# Patient Record
Sex: Male | Born: 1980 | Race: Black or African American | Hispanic: No | Marital: Single | Smoking: Never smoker
Health system: Southern US, Community
[De-identification: ages and names within clinical notes are randomized; demographics above are authoritative.]

## PROBLEM LIST (undated history)

## (undated) DIAGNOSIS — G4730 Sleep apnea, unspecified: Secondary | ICD-10-CM

## (undated) HISTORY — DX: Sleep apnea, unspecified: G47.30

---
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
## 2006-12-06 ENCOUNTER — Emergency Department (HOSPITAL_COMMUNITY): Admission: EM | Admit: 2006-12-06 | Discharge: 2006-12-06 | Payer: Self-pay | Admitting: Emergency Medicine

## 2008-05-19 ENCOUNTER — Emergency Department (HOSPITAL_COMMUNITY): Admission: EM | Admit: 2008-05-19 | Discharge: 2008-05-19 | Payer: Self-pay | Admitting: Emergency Medicine

## 2009-07-05 ENCOUNTER — Emergency Department (HOSPITAL_COMMUNITY): Admission: EM | Admit: 2009-07-05 | Discharge: 2009-07-05 | Payer: Self-pay | Admitting: Emergency Medicine

## 2010-02-26 ENCOUNTER — Emergency Department (HOSPITAL_COMMUNITY): Admission: EM | Admit: 2010-02-26 | Discharge: 2010-02-26 | Payer: Self-pay | Admitting: Emergency Medicine

## 2010-07-26 ENCOUNTER — Emergency Department (HOSPITAL_COMMUNITY)
Admission: EM | Admit: 2010-07-26 | Discharge: 2010-07-27 | Disposition: A | Discharge status: Home / Self Care | Payer: BC Managed Care – PPO | Attending: Emergency Medicine | Admitting: Emergency Medicine

## 2010-07-26 DIAGNOSIS — R112 Nausea with vomiting, unspecified: Secondary | ICD-10-CM | POA: Insufficient documentation

## 2010-07-26 DIAGNOSIS — R197 Diarrhea, unspecified: Secondary | ICD-10-CM | POA: Insufficient documentation

## 2010-07-26 LAB — URINALYSIS, ROUTINE W REFLEX MICROSCOPIC
Bilirubin Urine: NEGATIVE
Hgb urine dipstick: NEGATIVE
Ketones, ur: 15 mg/dL — AB
Leukocytes, UA: NEGATIVE
Nitrite: NEGATIVE
Protein, ur: 30 mg/dL — AB
Specific Gravity, Urine: 1.03 (ref 1.005–1.030)
Urine Glucose, Fasting: NEGATIVE mg/dL
Urobilinogen, UA: 0.2 mg/dL (ref 0.0–1.0)
pH: 5.5 (ref 5.0–8.0)

## 2010-07-26 LAB — DIFFERENTIAL
Basophils Absolute: 0 10*3/uL (ref 0.0–0.1)
Basophils Relative: 0 % (ref 0–1)
Eosinophils Absolute: 0 10*3/uL (ref 0.0–0.7)
Eosinophils Relative: 0 % (ref 0–5)
Lymphocytes Relative: 5 % — ABNORMAL LOW (ref 12–46)
Lymphs Abs: 0.6 10*3/uL — ABNORMAL LOW (ref 0.7–4.0)
Monocytes Absolute: 0.8 10*3/uL (ref 0.1–1.0)
Monocytes Relative: 6 % (ref 3–12)
Neutro Abs: 11.3 10*3/uL — ABNORMAL HIGH (ref 1.7–7.7)
Neutrophils Relative %: 89 % — ABNORMAL HIGH (ref 43–77)

## 2010-07-26 LAB — CBC
MCH: 29.6 pg (ref 26.0–34.0)
MCV: 86.7 fL (ref 78.0–100.0)
Platelets: 307 10*3/uL (ref 150–400)
RDW: 12.9 % (ref 11.5–15.5)

## 2010-07-26 LAB — BASIC METABOLIC PANEL
BUN: 15 mg/dL (ref 6–23)
Creatinine, Ser: 1.39 mg/dL (ref 0.4–1.5)
GFR calc non Af Amer: >60 mL/min (ref >60)

## 2010-07-26 LAB — URINE MICROSCOPIC-ADD ON

## 2010-07-27 LAB — HEPATIC FUNCTION PANEL
Alkaline Phosphatase: 128 U/L — ABNORMAL HIGH (ref 39–117)
Bilirubin, Direct: 0.1 mg/dL (ref 0.0–0.3)
Indirect Bilirubin: 0.7 mg/dL (ref 0.3–0.9)
Total Protein: 9.2 g/dL — ABNORMAL HIGH (ref 6.0–8.3)

## 2010-07-27 LAB — LIPASE, BLOOD: Lipase: 19 U/L (ref 11–59)

## 2011-07-20 ENCOUNTER — Ambulatory Visit (HOSPITAL_BASED_OUTPATIENT_CLINIC_OR_DEPARTMENT_OTHER): Payer: BC Managed Care – PPO | Attending: Otolaryngology | Admitting: Radiology

## 2011-07-20 VITALS — Ht 71.0 in | Wt 238.0 lb

## 2011-07-20 DIAGNOSIS — G4733 Obstructive sleep apnea (adult) (pediatric): Secondary | ICD-10-CM

## 2011-07-22 DIAGNOSIS — G4733 Obstructive sleep apnea (adult) (pediatric): Secondary | ICD-10-CM

## 2011-07-25 NOTE — Procedures (Signed)
NAMEPENG, THORSTENSON NO.:  [id number]  MEDICAL RECORD NO.:  [id number]          PATIENT TYPE:  OUT  LOCATION:  SLEEP CENTER                 FACILITY:  [facility]  PHYSICIAN:  Carlus Stay D. Lachae Hohler, MD, FCCP, FACPDATE OF BIRTH:  DATE OF STUDY:                           NOCTURNAL POLYSOMNOGRAM  REFERRING PHYSICIAN:  INDICATION FOR STUDY:  Hypersomnia with sleep apnea.  EPWORTH SLEEPINESS SCORE:  4/24.  BMI 33.2, weight 238 pounds, height 71 inches, neck 19 inches.  HOME MEDICATIONS:  Charted as "no medications.Marland Kitchen  SLEEP ARCHITECTURE:  Split study protocol.  During the diagnostic phase, total sleep time 126.5 minutes with sleep efficiency 70.3%.  Stage I was 11.5%.  Stage II 88.5%, stages III and REM were absent.  Sleep latency 42.5 minutes, awake after sleep onset 8.5 minutes.  Arousal index 37.9. Bedtime medication:  None.  RESPIRATORY DATA:  Split study protocol.  Apnea/hypopnea index (AHI) 47.0 per hour.  A total of 99 events was scored including 32 obstructive apneas, 67 hypopneas.  Events were not positional.  CPAP was then titrated to 15 CWP, AHI 5.1 per hour.  He wore a medium ResMed Mirage Quattro fullface mask with heated humidifier and a C-Flex setting of 3.  OXYGEN DATA:  Before CPAP, snoring was loud with oxygen desaturation to a nadir of 83% on room air.  After CPAP titration, snoring was prevented and mean oxygen saturation held 96.1% on room air.  CARDIAC DATA:  Normal sinus rhythm.  MOVEMENT/PARASOMNIA:  No significant movement disturbance.  Bathroom x1.  IMPRESSION/RECOMMENDATION: 1. Severe obstructive sleep apnea/hypopnea syndrome, AHI 47 per hour     with nonpositional events.  Loud snoring and oxygen desaturation to     a nadir of 83% on room air. 2. Successful CPAP titration to 15 CWP, AHI 5.1 per hour.  He wore a     medium ResMed Mirage Quattro full-face mask with heated humidifier and a        C-Flex setting of 3.  Snoring was prevented and  mean oxygen saturation     normalized.     Madelaine Whipple D. Maple Hudson, MD, [facility], FACP Diplomate, American Board of Sleep Medicine    CDY/MEDQ  D:  07/22/2011 11:38:35  T:  07/22/2011 13:12:38  Job:  [id number]

## 2011-10-12 ENCOUNTER — Emergency Department (HOSPITAL_COMMUNITY): Payer: BC Managed Care – PPO

## 2011-10-12 ENCOUNTER — Encounter (HOSPITAL_COMMUNITY): Payer: Self-pay | Admitting: Emergency Medicine

## 2011-10-12 ENCOUNTER — Emergency Department (HOSPITAL_COMMUNITY)
Admission: EM | Admit: 2011-10-12 | Discharge: 2011-10-13 | Disposition: A | Discharge status: Home / Self Care | Payer: BC Managed Care – PPO | Attending: Emergency Medicine | Admitting: Emergency Medicine

## 2011-10-12 DIAGNOSIS — R05 Cough: Secondary | ICD-10-CM | POA: Insufficient documentation

## 2011-10-12 DIAGNOSIS — J40 Bronchitis, not specified as acute or chronic: Secondary | ICD-10-CM | POA: Insufficient documentation

## 2011-10-12 DIAGNOSIS — J45909 Unspecified asthma, uncomplicated: Secondary | ICD-10-CM | POA: Insufficient documentation

## 2011-10-12 DIAGNOSIS — R059 Cough, unspecified: Secondary | ICD-10-CM | POA: Insufficient documentation

## 2011-10-12 DIAGNOSIS — R0602 Shortness of breath: Secondary | ICD-10-CM | POA: Insufficient documentation

## 2011-10-12 DIAGNOSIS — M546 Pain in thoracic spine: Secondary | ICD-10-CM | POA: Insufficient documentation

## 2011-10-12 NOTE — ED Notes (Signed)
PT. REPORTS UPPER BACK PAIN WITH SOB AND PRODUCTIVE COUGH ONSET YESTERDAY.

## 2011-10-12 NOTE — ED Notes (Signed)
Patient transported to X-ray 

## 2011-10-13 MED ORDER — AEROCHAMBER PLUS W/MASK MISC
Status: AC
  Administered 2011-10-13: 01:00:00
  Filled 2011-10-13: qty 1

## 2011-10-13 MED ORDER — IBUPROFEN 800 MG PO TABS: 800.0000 mg | ORAL_TABLET | Freq: Four times a day (QID) | ORAL | Status: AC | PRN

## 2011-10-13 MED ORDER — ALBUTEROL SULFATE HFA 108 (90 BASE) MCG/ACT IN AERS
2.0000 | INHALATION_SPRAY | Freq: Once | RESPIRATORY_TRACT | Status: AC
  Administered 2011-10-13: 2 via RESPIRATORY_TRACT
  Filled 2011-10-13: qty 6.7

## 2011-10-13 MED ORDER — IBUPROFEN 800 MG PO TABS
800.0000 mg | ORAL_TABLET | Freq: Once | ORAL | Status: AC
  Administered 2011-10-13: 800 mg via ORAL
  Filled 2011-10-13: qty 1

## 2011-10-13 MED ORDER — AZITHROMYCIN 250 MG PO TABS: 250.0000 mg | ORAL_TABLET | Freq: Every day | ORAL | Status: AC

## 2011-10-13 NOTE — ED Provider Notes (Signed)
Medical screening examination/treatment/procedure(s) were performed by non-physician practitioner and as supervising physician I was immediately available for consultation/collaboration.  Olivia Mackie, MD 10/13/11 [phone]

## 2011-10-13 NOTE — ED Provider Notes (Signed)
History     CSN: [id number]  Arrival date & time 10/12/11  2321   First MD Initiated Contact with Patient 10/12/11 2345      Chief Complaint  Patient presents with  . Back Pain    (Consider location/radiation/quality/duration/timing/severity/associated sxs/prior treatment) HPI Comments: Patient here with cough, shortness of breath and upper back pain since yesterday - he states no fever or chills, reports pain worse with lying flat - denies chest pain or wheezing - reports that one of his co-workers was ill the other day and coughed on him - no headache, sore throat, neck pain.  Patient is a 31 y.o. male presenting with back pain. The history is provided by the patient. No language interpreter was used.  Back Pain  This is a new problem. The current episode started yesterday. The problem occurs constantly. The problem has not changed since onset.The pain is associated with no known injury. The pain is present in the thoracic spine. The quality of the pain is described as shooting. The pain does not radiate. The pain is at a severity of 4/10. The pain is mild. The symptoms are aggravated by bending. The pain is the same all the time. Stiffness is present all day. Pertinent negatives include no chest pain, no fever, no numbness, no weight loss, no headaches, no abdominal pain, no abdominal swelling, no bowel incontinence, no perianal numbness, no bladder incontinence, no dysuria, no pelvic pain, no leg pain, no paresthesias, no paresis, no tingling and no weakness. The treatment provided no relief.    Past Medical History  Diagnosis Date  . Asthma     History reviewed. No pertinent past surgical history.  No family history on file.  History  Substance Use Topics  . Smoking status: Never Smoker   . Smokeless tobacco: Not on file  . Alcohol Use: Yes      Review of Systems  Constitutional: Negative for fever and weight loss.  Cardiovascular: Negative for chest pain.    Gastrointestinal: Negative for abdominal pain and bowel incontinence.  Genitourinary: Negative for bladder incontinence, dysuria and pelvic pain.  Musculoskeletal: Positive for back pain.  Neurological: Negative for tingling, weakness, numbness, headaches and paresthesias.  All other systems reviewed and are negative.    Allergies  Review of patient's allergies indicates no known allergies.  Home Medications   Current Outpatient Rx  Name Route Sig Dispense Refill  . ADULT MULTIVITAMIN W/MINERALS CH Oral Take 1 tablet by mouth daily.    . AZITHROMYCIN 250 MG PO TABS Oral Take 1 tablet (250 mg total) by mouth daily. Take first 2 tablets together, then 1 every day until finished. 6 tablet 0  . IBUPROFEN 800 MG PO TABS Oral Take 1 tablet (800 mg total) by mouth every 6 (six) hours as needed for pain. 30 tablet 0    BP 139/73  Pulse 87  Temp(Src) 99.7 F (37.6 C) (Oral)  Resp 22  SpO2 93%  Physical Exam  Nursing note and vitals reviewed. Constitutional: He is oriented to person, place, and time. He appears well-developed and well-nourished. No distress.  HENT:  Head: Normocephalic and atraumatic.  Right Ear: External ear normal.  Left Ear: External ear normal.  Nose: Nose normal.  Mouth/Throat: Oropharynx is clear and moist. No oropharyngeal exudate.  Eyes: Conjunctivae are normal. Pupils are equal, round, and reactive to light. No scleral icterus.  Neck: Normal range of motion. Neck supple.  Cardiovascular: Normal rate, regular rhythm and normal heart sounds.  Exam  reveals no gallop and no friction rub.   No murmur heard. Pulmonary/Chest: Effort normal and breath sounds normal. No respiratory distress. He has no wheezes. He has no rales. He exhibits no tenderness.  Abdominal: Soft. Bowel sounds are normal. He exhibits no distension and no mass. There is no tenderness. There is no rebound and no guarding.  Musculoskeletal: Normal range of motion. He exhibits no edema and no  tenderness.       No ttp to thoracic back  Lymphadenopathy:    He has no cervical adenopathy.  Neurological: He is alert and oriented to person, place, and time. No cranial nerve deficit.  Skin: Skin is warm and dry. No rash noted. No erythema. No pallor.  Psychiatric: He has a normal mood and affect. His behavior is normal. Judgment and thought content normal.    ED Course  Procedures (including critical care time)  Labs Reviewed - No data to display Dg Chest 2 View  10/13/2011  *RADIOLOGY REPORT*  Clinical Data: Upper back pain and shortness of breath  CHEST - 2 VIEW  Comparison: 02/14/2010  Findings: The heart size and pulmonary vascularity are normal. The lungs appear clear and expanded without focal air space disease or consolidation. No blunting of the costophrenic angles.  No pneumothorax.  No significant change since previous study.  IMPRESSION: No evidence of active pulmonary disease.  Original Report Authenticated By: Marlon Pel, M.D.     1. Bronchitis       MDM  Patient with cough with green sputum production, mid upper back pain and mild shortness of breath, xray negative for PNA but likely with bronchitis - will treat for same.        Izola Price [place], [place] 10/13/11 [phone]

## 2011-10-13 NOTE — ED Notes (Signed)
PT ambulated with a steady gait; VSS; A&Ox3; no signs of distress; respirations even and unlabored; skin warm and dry.  

## 2011-10-13 NOTE — Discharge Instructions (Signed)

## 2012-05-16 IMAGING — CR DG LUMBAR SPINE COMPLETE 4+V
5 series · 5 of 5 positions shown · non-contrast
Comparison: None

CLINICAL DATA: Back pain.  Lifting injury.

LUMBAR SPINE - COMPLETE 4+ VIEW

[t l-spine a.p.]
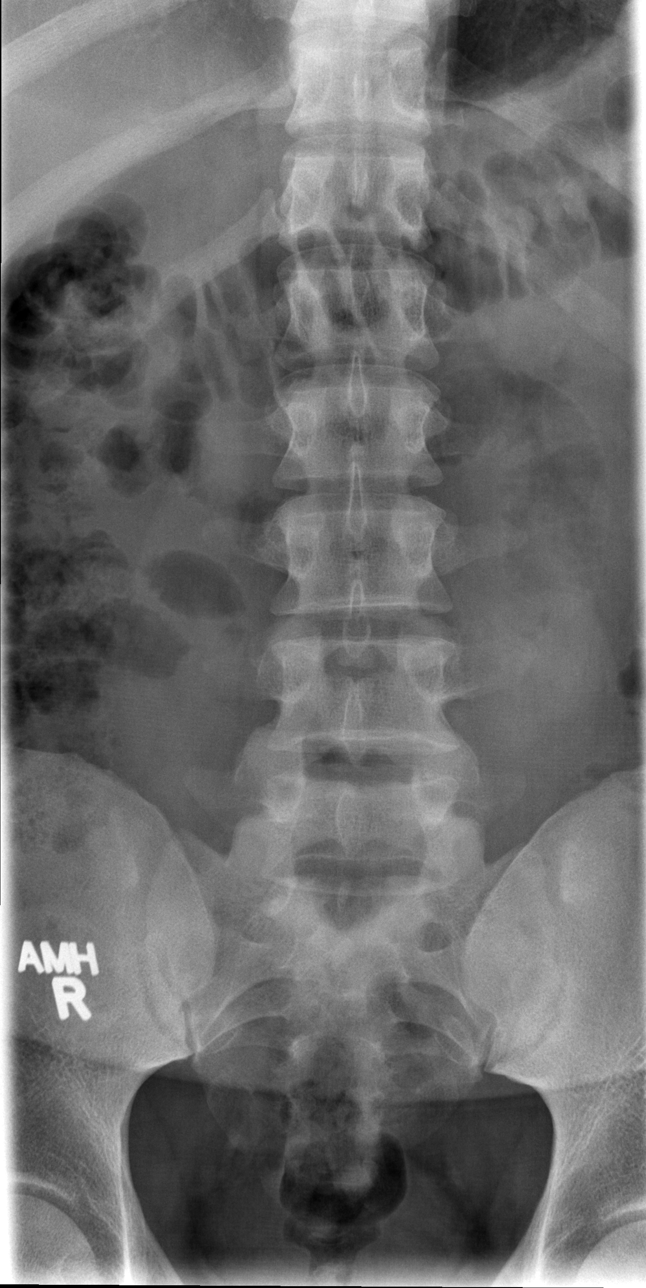

[t l-spine oblique exposure (1 of 2)]
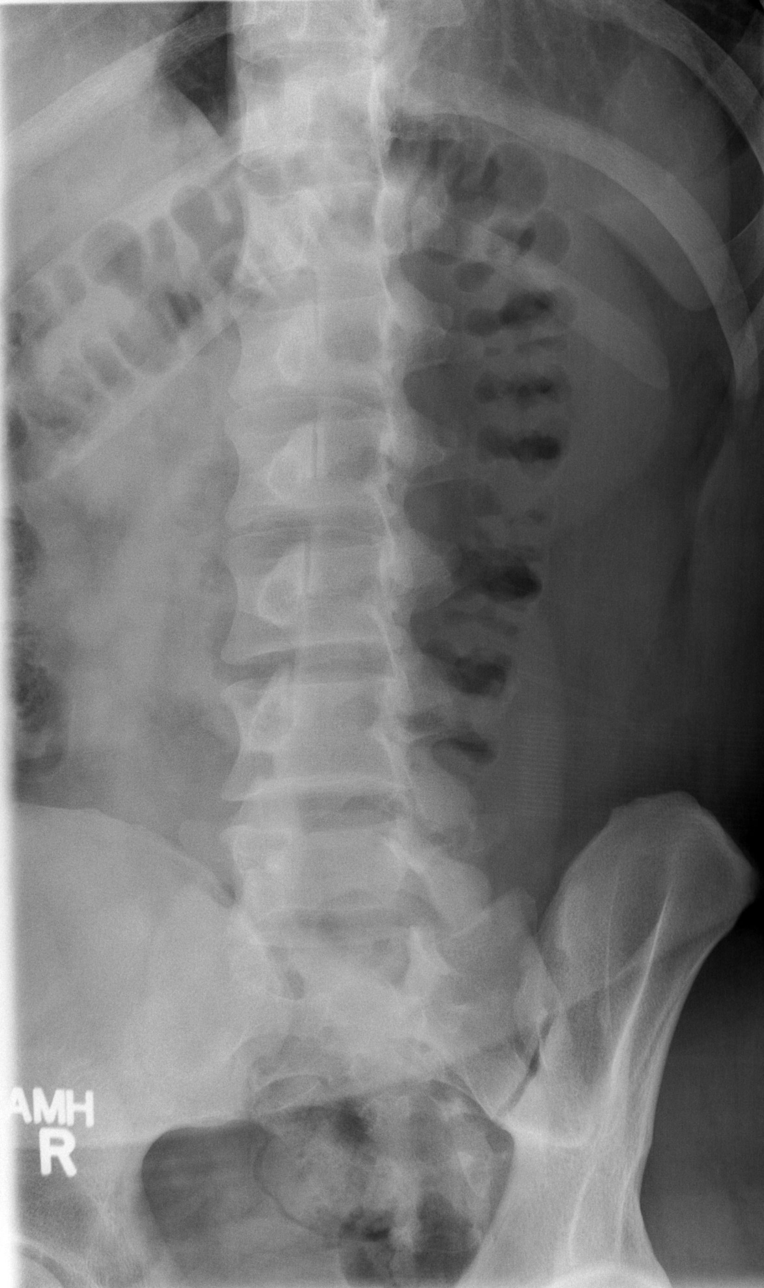

[t l-spine oblique exposure (2 of 2)]
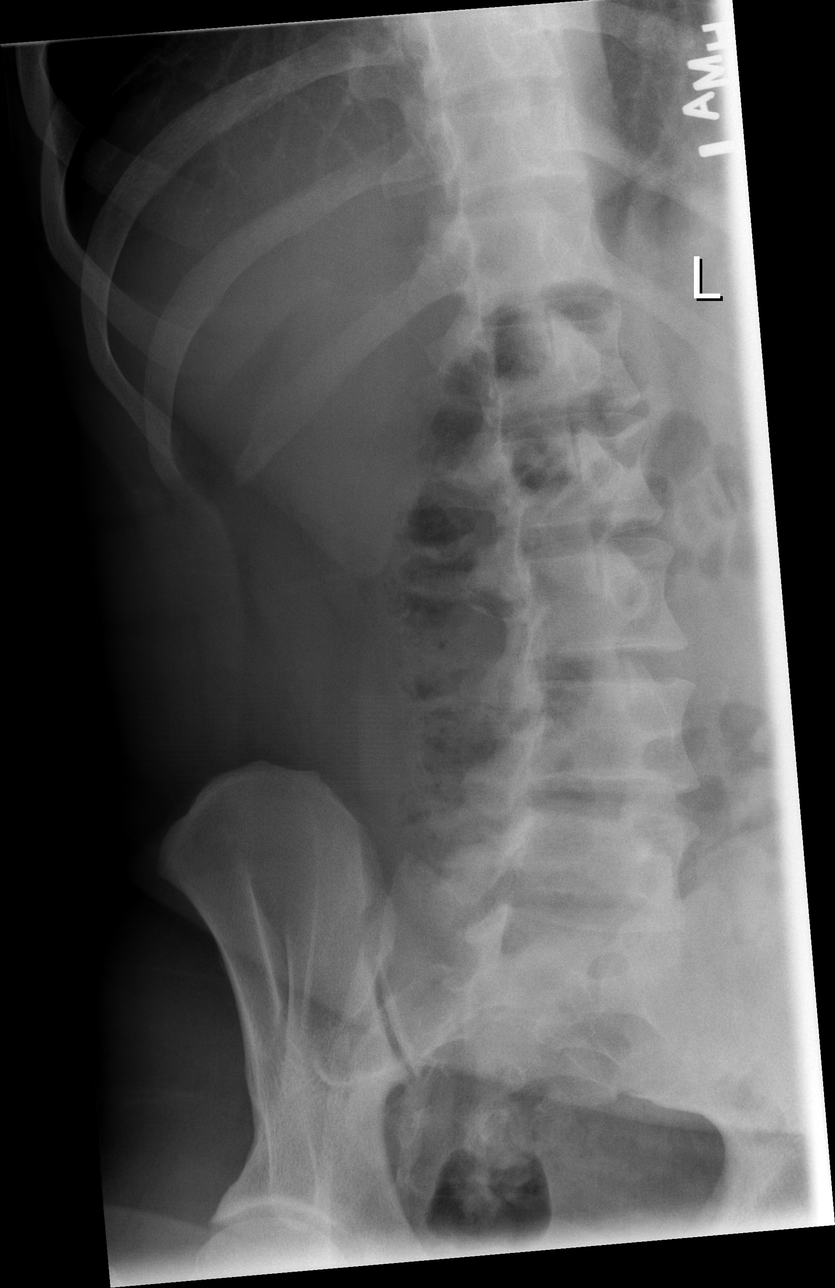

[t l-spine lat *]
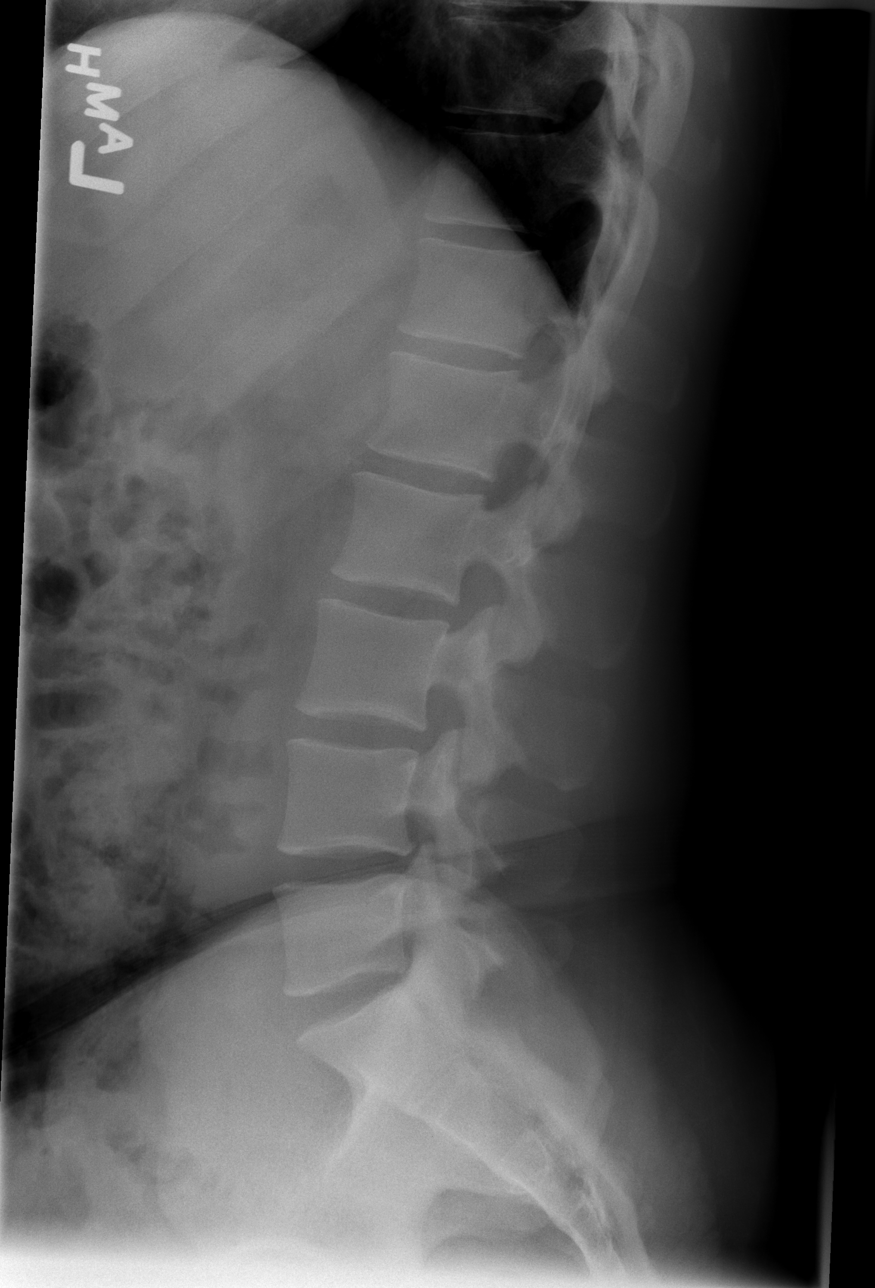

[t l-spine l5-s1 spot]
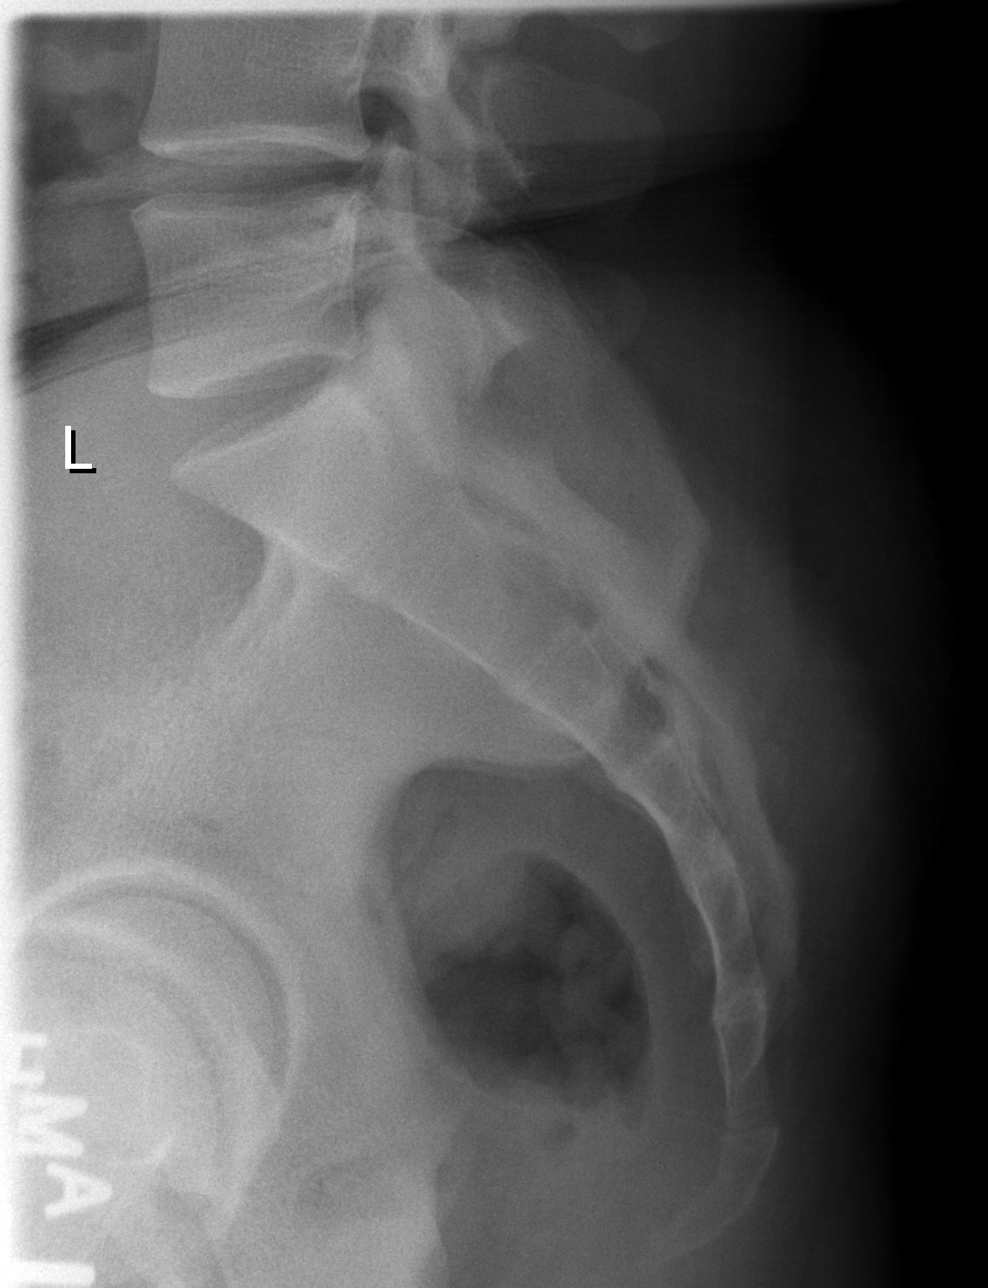

[5 of 5 positions shown; findings below may reference images not displayed]

FINDINGS: Alignment is normal.  Disc space heights are within
normal limits.  No osseous or articular pathology.
IMPRESSION: Normal radiographs

## 2013-12-31 IMAGING — CR DG CHEST 2V
2 series · 2 of 2 positions shown · non-contrast
Comparison: 02/14/2010

CLINICAL DATA: Upper back pain and shortness of breath

CHEST - 2 VIEW

[w chest pa]
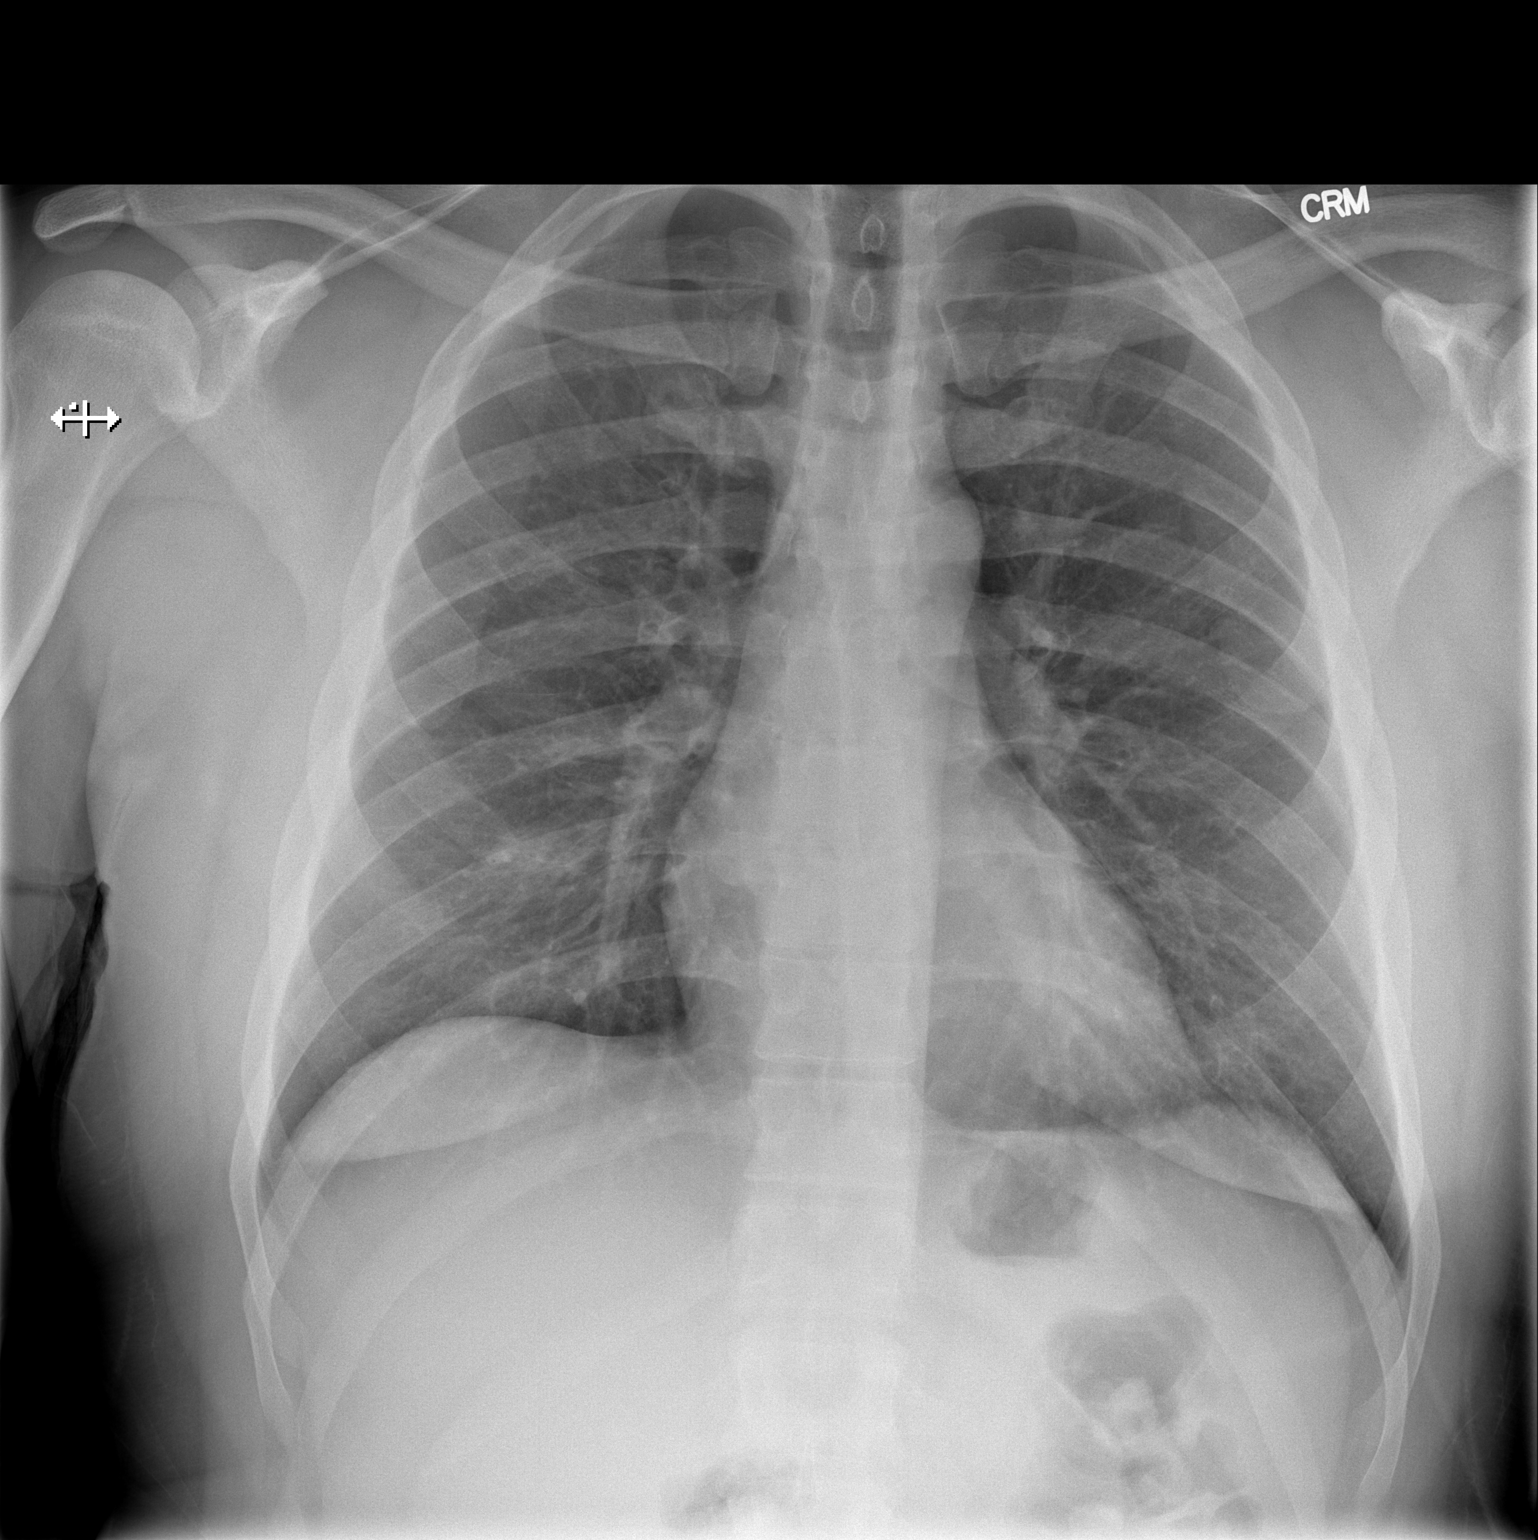

[w chest lat]
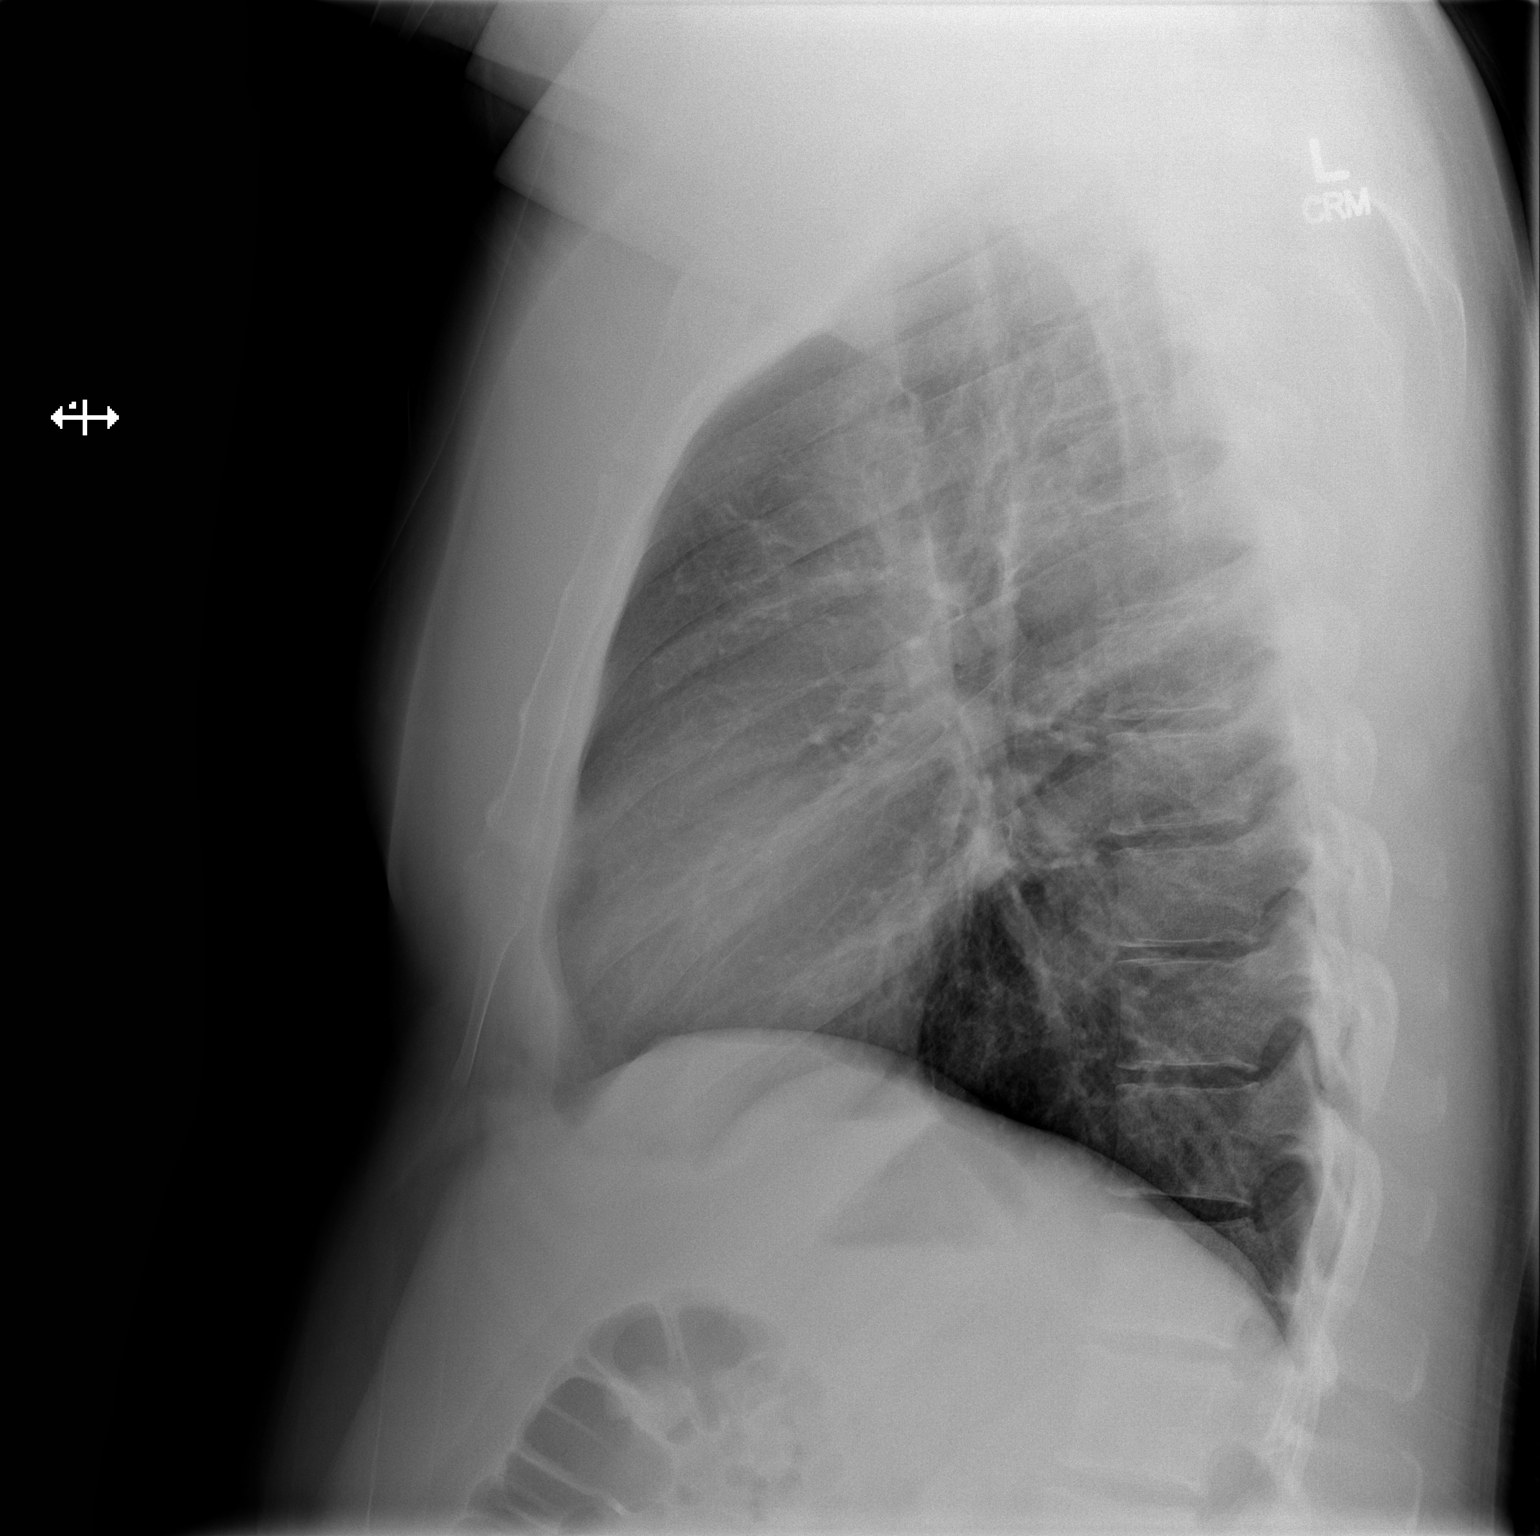

[2 of 2 positions shown; findings below may reference images not displayed]

FINDINGS: The heart size and pulmonary vascularity are normal. The
lungs appear clear and expanded without focal air space disease or
consolidation. No blunting of the costophrenic angles.  No
pneumothorax.  No significant change since previous study.
IMPRESSION: No evidence of active pulmonary disease.

## 2014-03-08 ENCOUNTER — Ambulatory Visit (HOSPITAL_BASED_OUTPATIENT_CLINIC_OR_DEPARTMENT_OTHER): Payer: BLUE CROSS/BLUE SHIELD | Attending: Internal Medicine

## 2014-03-08 VITALS — Ht 71.0 in | Wt 240.0 lb

## 2014-03-08 DIAGNOSIS — G4733 Obstructive sleep apnea (adult) (pediatric): Secondary | ICD-10-CM

## 2014-03-08 DIAGNOSIS — Z9989 Dependence on other enabling machines and devices: Secondary | ICD-10-CM

## 2014-03-08 DIAGNOSIS — G4730 Sleep apnea, unspecified: Secondary | ICD-10-CM | POA: Diagnosis present

## 2014-03-11 DIAGNOSIS — G4733 Obstructive sleep apnea (adult) (pediatric): Secondary | ICD-10-CM

## 2014-03-11 NOTE — Sleep Study (Signed)
   NAME: Trevor Terrell DATE OF BIRTH:  1980/10/30 MEDICAL RECORD NUMBER [id number]  LOCATION: [facility]  PHYSICIAN: Kazzandra Desaulniers D  DATE OF STUDY: 03/08/2014  SLEEP STUDY TYPE: Nocturnal Polysomnogram               REFERRING PHYSICIAN: Dillard CannonNewman, Christopher E, *  INDICATION FOR STUDY: Hypersomnia with sleep apnea  EPWORTH SLEEPINESS SCORE:   8/24 HEIGHT: 5\' 11"  (180.3 cm)  WEIGHT: 240 lb (108.863 kg)    Body mass index is 33.49 kg/(m^2).  NECK SIZE: 19 in.  MEDICATIONS: Charted for review  SLEEP ARCHITECTURE: Split study protocol. During the diagnostic phase, total sleep time 128.5 minutes sleep efficiency 55.9%. Stage I was 15.6%, stage II 65%, stage III absent, REM 19.5% of total sleep time. Sleep latency 48 minutes, REM latency 145 minutes, awake after sleep onset 34.5 minutes, rales oh index 5.6, bedtime medication: None  RESPIRATORY DATA: Apnea hypopneas index (AHI) 66.3 per hour. 142 total events scored including 53 obstructive apneas and 89 hypopneas. Events were seen in all positions. REM AHI 91.2 per hour. CPAP was titrated to 16 CWP, AHI 0 per hour. He wore a medium fullface mask.  OXYGEN DATA: Moderately loud snoring before CPAP with oxygen desaturation to a nadir of 69% on room air. With CPAP control, snoring was prevented and mean oxygen saturation was 94.9% on room air.  CARDIAC DATA: Normal sinus rhythm  MOVEMENT/PARASOMNIA: No significant movement disturbance, no bathroom trips  IMPRESSION/ RECOMMENDATION:   1) Severe obstructive sleep apnea/hypopneas syndrome, AHI 66.3 per hour with non-positional events. REM AHI 91.2 per hour. Moderately loud snoring with oxygen desaturation to a nadir of 69% on room air. 2) Successful CPAP titration to 16 CWP, AHI 0 per hour. He wore a medium ResMed AirFit F10 fullface mask with heated humidifier and an EP R. of 3. Snoring was prevented and mean oxygen saturation was 94.9% on room air. 3) A previous  polysomnogram on 07/20/2011 recorded AHI 47 per hour with CPAP titration to 15. Body weight was recorded 238 pounds.   Waymon BudgeYOUNG,Mikal Blasdell D Diplomate, American Board of Sleep Medicine  ELECTRONICALLY SIGNED ON:  03/11/2014, 1:33 PM [facility] PH: [phone]   FX: [phone] ACCREDITED BY THE AMERICAN ACADEMY OF SLEEP MEDICINE

## 2014-07-04 ENCOUNTER — Encounter (HOSPITAL_COMMUNITY): Payer: Self-pay | Admitting: *Deleted

## 2014-07-04 ENCOUNTER — Emergency Department (HOSPITAL_COMMUNITY)
Admission: EM | Admit: 2014-07-04 | Discharge: 2014-07-05 | Disposition: A | Discharge status: Home / Self Care | Payer: BLUE CROSS/BLUE SHIELD | Attending: Emergency Medicine | Admitting: Emergency Medicine

## 2014-07-04 DIAGNOSIS — Z79899 Other long term (current) drug therapy: Secondary | ICD-10-CM | POA: Insufficient documentation

## 2014-07-04 DIAGNOSIS — J45909 Unspecified asthma, uncomplicated: Secondary | ICD-10-CM | POA: Insufficient documentation

## 2014-07-04 DIAGNOSIS — R197 Diarrhea, unspecified: Secondary | ICD-10-CM | POA: Insufficient documentation

## 2014-07-04 MED ORDER — SODIUM CHLORIDE 0.9 % IV SOLN
1000.0000 mL | INTRAVENOUS | Status: DC
  Administered 2014-07-05: 1000 mL via INTRAVENOUS

## 2014-07-04 MED ORDER — SODIUM CHLORIDE 0.9 % IV SOLN
1000.0000 mL | Freq: Once | INTRAVENOUS | Status: AC
  Administered 2014-07-05: 1000 mL via INTRAVENOUS

## 2014-07-04 MED ORDER — LOPERAMIDE HCL 2 MG PO CAPS
4.0000 mg | ORAL_CAPSULE | Freq: Once | ORAL | Status: AC
  Administered 2014-07-05: 4 mg via ORAL
  Filled 2014-07-04: qty 2

## 2014-07-04 NOTE — ED Provider Notes (Signed)
CSN: [id number]     Arrival date & time 07/04/14  2319 History   First MD Initiated Contact with Patient 07/04/14 2340    This chart was scribed for Dione Boozeavid Aiven Kampe, MD by Marica OtterNusrat Rahman, ED Scribe. This patient was seen in room A09C/A09C and the patient's care was started at 11:44 PM.  Chief Complaint  Patient presents with  . Diarrhea   The history is provided by the patient. No language interpreter was used.   PCP: Dr. Suzie PortelaPayne  HPI Comments: Trevor Terrell is a 34 y.o. male, with PMH noted below, who presents to the Emergency Department complaining of intermittent diarrhea with associated fever, chills and body aches onset 3 days ago. Pt notes that the fever, chills and body aches have resolved. Pt denies abd pain, n/v, blood in stool, or weakness. Pt reports taking OTC meds including pepto bismol and anti-diarrhea meds at home without relief. He denies recent antibiotic use. Past Medical History  Diagnosis Date  . Asthma    History reviewed. No pertinent past surgical history. No family history on file. History  Substance Use Topics  . Smoking status: Never Smoker   . Smokeless tobacco: Not on file  . Alcohol Use: Yes    Review of Systems  Constitutional: Negative for fever and chills.  Gastrointestinal: Positive for diarrhea. Negative for nausea, vomiting, abdominal pain and blood in stool.  Neurological: Negative for weakness.  Psychiatric/Behavioral: Negative for confusion.  All other systems reviewed and are negative.  Allergies  Review of patient's allergies indicates no known allergies.  Home Medications   Prior to Admission medications   Medication Sig Start Date End Date Taking? Authorizing Provider  Multiple Vitamin (MULITIVITAMIN WITH MINERALS) TABS Take 1 tablet by mouth daily.    Historical Provider, MD   Triage Vitals: BP 135/72 mmHg  Pulse 85  Temp(Src) 98.7 F (37.1 C) (Oral)  Resp 18  Ht 5\' 11"  (1.803 m)  Wt 245 lb (111.131 kg)  BMI 34.19 kg/m2  SpO2  96% Physical Exam  Constitutional: He is oriented to person, place, and time. He appears well-developed and well-nourished. No distress.  HENT:  Head: Normocephalic and atraumatic.  Eyes: Conjunctivae and EOM are normal.  Neck: Neck supple.  Cardiovascular: Normal rate.   Pulmonary/Chest: Effort normal. No respiratory distress.  Musculoskeletal: Normal range of motion.  Neurological: He is alert and oriented to person, place, and time.  Skin: Skin is warm and dry.  Psychiatric: He has a normal mood and affect. His behavior is normal.  Nursing note and vitals reviewed.   ED Course  Procedures (including critical care time) DIAGNOSTIC STUDIES: Oxygen Saturation is 96% on RA, nl by my interpretation.    COORDINATION OF CARE: 11:48 PM-Discussed treatment plan with pt at bedside and pt agreed to plan.   Labs Review Results for orders placed or performed during the hospital encounter of 07/04/14  Basic metabolic panel  Result Value Ref Range   Sodium 138 135 - 145 mmol/L   Potassium 3.6 3.5 - 5.1 mmol/L   Chloride 106 96 - 112 mmol/L   CO2 24 19 - 32 mmol/L   Glucose, Bld 97 70 - 99 mg/dL   BUN 14 6 - 23 mg/dL   Creatinine, Ser 6.961.14 0.50 - 1.35 mg/dL   Calcium 8.8 8.4 - 29.510.5 mg/dL   GFR calc non Af Amer 83 (L) >90 mL/min   GFR calc Af Amer >90 >90 mL/min   Anion gap 8 5 - 15  CBC with Differential  Result Value Ref Range   WBC 6.5 4.0 - 10.5 K/uL   RBC 5.49 4.22 - 5.81 MIL/uL   Hemoglobin 16.1 13.0 - 17.0 g/dL   HCT 16.1 09.6 - 04.5 %   MCV 82.5 78.0 - 100.0 fL   MCH 29.3 26.0 - 34.0 pg   MCHC 35.5 30.0 - 36.0 g/dL   RDW 40.9 81.1 - 91.4 %   Platelets 289 150 - 400 K/uL   Neutrophils Relative % 46 43 - 77 %   Neutro Abs 3.0 1.7 - 7.7 K/uL   Lymphocytes Relative 34 12 - 46 %   Lymphs Abs 2.2 0.7 - 4.0 K/uL   Monocytes Relative 16 (H) 3 - 12 %   Monocytes Absolute 1.0 0.1 - 1.0 K/uL   Eosinophils Relative 3 0 - 5 %   Eosinophils Absolute 0.2 0.0 - 0.7 K/uL    Basophils Relative 1 0 - 1 %   Basophils Absolute 0.0 0.0 - 0.1 K/uL  Urinalysis, Routine w reflex microscopic  Result Value Ref Range   Color, Urine YELLOW YELLOW   APPearance CLEAR CLEAR   Specific Gravity, Urine 1.033 (H) 1.005 - 1.030   pH 5.0 5.0 - 8.0   Glucose, UA NEGATIVE NEGATIVE mg/dL   Hgb urine dipstick NEGATIVE NEGATIVE   Bilirubin Urine NEGATIVE NEGATIVE   Ketones, ur NEGATIVE NEGATIVE mg/dL   Protein, ur NEGATIVE NEGATIVE mg/dL   Urobilinogen, UA 0.2 0.0 - 1.0 mg/dL   Nitrite NEGATIVE NEGATIVE   Leukocytes, UA NEGATIVE NEGATIVE     MDM   Final diagnoses:  Diarrhea    Diarrhea with out evidence of serious underlying pathology. He'll be given IV fluids and a dose of loperamide.  He feels better after above noted treatment. Workup is significant for mild increase in urine specific gravity consistent with dehydration, and relative monocytosis consistent with viral illness. He is discharged with instructions to take over-the-counter loperamide as needed for diarrhea.  I personally performed the services described in this documentation, which was scribed in my presence. The recorded information has been reviewed and is accurate.     Dione Booze, MD 07/05/14 [phone]

## 2014-07-04 NOTE — ED Notes (Signed)
Diarrhea for 3 days,  No pain.

## 2014-07-05 LAB — URINALYSIS, ROUTINE W REFLEX MICROSCOPIC
BILIRUBIN URINE: NEGATIVE
Glucose, UA: NEGATIVE mg/dL
Hgb urine dipstick: NEGATIVE
KETONES UR: NEGATIVE mg/dL
LEUKOCYTES UA: NEGATIVE
Nitrite: NEGATIVE
Protein, ur: NEGATIVE mg/dL
Specific Gravity, Urine: 1.033 — ABNORMAL HIGH (ref 1.005–1.030)
Urobilinogen, UA: 0.2 mg/dL (ref 0.0–1.0)
pH: 5 (ref 5.0–8.0)

## 2014-07-05 LAB — CBC WITH DIFFERENTIAL/PLATELET
BASOS ABS: 0 10*3/uL (ref 0.0–0.1)
BASOS PCT: 1 % (ref 0–1)
EOS ABS: 0.2 10*3/uL (ref 0.0–0.7)
Eosinophils Relative: 3 % (ref 0–5)
HCT: 45.3 % (ref 39.0–52.0)
HEMOGLOBIN: 16.1 g/dL (ref 13.0–17.0)
Lymphocytes Relative: 34 % (ref 12–46)
Lymphs Abs: 2.2 10*3/uL (ref 0.7–4.0)
MCH: 29.3 pg (ref 26.0–34.0)
MCHC: 35.5 g/dL (ref 30.0–36.0)
MCV: 82.5 fL (ref 78.0–100.0)
MONOS PCT: 16 % — AB (ref 3–12)
Monocytes Absolute: 1 10*3/uL (ref 0.1–1.0)
NEUTROS PCT: 46 % (ref 43–77)
Neutro Abs: 3 10*3/uL (ref 1.7–7.7)
PLATELETS: 289 10*3/uL (ref 150–400)
RBC: 5.49 MIL/uL (ref 4.22–5.81)
RDW: 12.9 % (ref 11.5–15.5)
WBC: 6.5 10*3/uL (ref 4.0–10.5)

## 2014-07-05 LAB — BASIC METABOLIC PANEL
ANION GAP: 8 (ref 5–15)
BUN: 14 mg/dL (ref 6–23)
CO2: 24 mmol/L (ref 19–32)
CREATININE: 1.14 mg/dL (ref 0.50–1.35)
Calcium: 8.8 mg/dL (ref 8.4–10.5)
Chloride: 106 mmol/L (ref 96–112)
GFR, EST NON AFRICAN AMERICAN: 83 mL/min — AB (ref >90)
GLUCOSE: 97 mg/dL (ref 70–99)
Potassium: 3.6 mmol/L (ref 3.5–5.1)
Sodium: 138 mmol/L (ref 135–145)

## 2014-07-05 NOTE — Discharge Instructions (Signed)
TAke loperamide (Imodium AD) as needed for diarrhea - you can take it up to four times a day. If diarrhea is not improving over the next three days, then return to the ED.  Diarrhea Diarrhea is frequent loose and watery bowel movements. It can cause you to feel weak and dehydrated. Dehydration can cause you to become tired and thirsty, have a dry mouth, and have decreased urination that often is dark yellow. Diarrhea is a sign of another problem, most often an infection that will not last long. In most cases, diarrhea typically lasts 2-3 days. However, it can last longer if it is a sign of something more serious. It is important to treat your diarrhea as directed by your caregiver to lessen or prevent future episodes of diarrhea. CAUSES  Some common causes include:  Gastrointestinal infections caused by viruses, bacteria, or parasites.  Food poisoning or food allergies.  Certain medicines, such as antibiotics, chemotherapy, and laxatives.  Artificial sweeteners and fructose.  Digestive disorders. HOME CARE INSTRUCTIONS  Ensure adequate fluid intake (hydration): Have 1 cup (8 oz) of fluid for each diarrhea episode. Avoid fluids that contain simple sugars or sports drinks, fruit juices, whole milk products, and sodas. Your urine should be clear or pale yellow if you are drinking enough fluids. Hydrate with an oral rehydration solution that you can purchase at pharmacies, retail stores, and online. You can prepare an oral rehydration solution at home by mixing the following ingredients together:   - tsp table salt.   tsp baking soda.   tsp salt substitute containing potassium chloride.  1  tablespoons sugar.  1 L (34 oz) of water.  Certain foods and beverages may increase the speed at which food moves through the gastrointestinal (GI) tract. These foods and beverages should be avoided and include:  Caffeinated and alcoholic beverages.  High-fiber foods, such as raw fruits and  vegetables, nuts, seeds, and whole grain breads and cereals.  Foods and beverages sweetened with sugar alcohols, such as xylitol, sorbitol, and mannitol.  Some foods may be well tolerated and may help thicken stool including:  Starchy foods, such as rice, toast, pasta, low-sugar cereal, oatmeal, grits, baked potatoes, crackers, and bagels.  Bananas.  Applesauce.  Add probiotic-rich foods to help increase healthy bacteria in the GI tract, such as yogurt and fermented milk products.  Wash your hands well after each diarrhea episode.  Only take over-the-counter or prescription medicines as directed by your caregiver.  Take a warm bath to relieve any burning or pain from frequent diarrhea episodes. SEEK IMMEDIATE MEDICAL CARE IF:   You are unable to keep fluids down.  You have persistent vomiting.  You have blood in your stool, or your stools are black and tarry.  You do not urinate in 6-8 hours, or there is only a small amount of very dark urine.  You have abdominal pain that increases or localizes.  You have weakness, dizziness, confusion, or light-headedness.  You have a severe headache.  Your diarrhea gets worse or does not get better.  You have a fever or persistent symptoms for more than 2-3 days.  You have a fever and your symptoms suddenly get worse. MAKE SURE YOU:   Understand these instructions.  Will watch your condition.  Will get help right away if you are not doing well or get worse. Document Released: 05/05/2002 Document Revised: 09/29/2013 Document Reviewed: 01/21/2012 [facility] Patient Information 2015 [employer], [place]. This information is not intended to replace advice given to you  by your health care provider. Make sure you discuss any questions you have with your health care provider. ° °

## 2022-05-02 ENCOUNTER — Emergency Department (HOSPITAL_COMMUNITY)
Admission: EM | Admit: 2022-05-02 | Discharge: 2022-05-02 | Disposition: A | Discharge status: Home / Self Care | Payer: BC Managed Care – PPO | Attending: Emergency Medicine | Admitting: Emergency Medicine

## 2022-05-02 ENCOUNTER — Other Ambulatory Visit: Payer: Self-pay

## 2022-05-02 ENCOUNTER — Encounter (HOSPITAL_COMMUNITY): Payer: Self-pay

## 2022-05-02 DIAGNOSIS — L509 Urticaria, unspecified: Secondary | ICD-10-CM | POA: Diagnosis not present

## 2022-05-02 DIAGNOSIS — R21 Rash and other nonspecific skin eruption: Secondary | ICD-10-CM | POA: Diagnosis present

## 2022-05-02 MED ORDER — FAMOTIDINE 20 MG PO TABS: 20.0000 mg | ORAL_TABLET | Freq: Every day | ORAL | 0 refills | Status: DC

## 2022-05-02 MED ORDER — HYDROCORTISONE 1 % EX CREA: TOPICAL_CREAM | CUTANEOUS | 0 refills | Status: DC

## 2022-05-02 MED ORDER — PREDNISONE 50 MG PO TABS: ORAL_TABLET | ORAL | 0 refills | Status: DC

## 2022-05-02 MED ORDER — PREDNISONE 20 MG PO TABS
60.0000 mg | ORAL_TABLET | Freq: Once | ORAL | Status: AC
  Administered 2022-05-02: 60 mg via ORAL
  Filled 2022-05-02: qty 3

## 2022-05-02 MED ORDER — DIPHENHYDRAMINE HCL 25 MG PO CAPS
25.0000 mg | ORAL_CAPSULE | Freq: Once | ORAL | Status: AC
  Administered 2022-05-02: 25 mg via ORAL
  Filled 2022-05-02: qty 1

## 2022-05-02 MED ORDER — DIPHENHYDRAMINE HCL 25 MG PO TABS: 25.0000 mg | ORAL_TABLET | Freq: Four times a day (QID) | ORAL | 0 refills | Status: DC | PRN

## 2022-05-02 MED ORDER — FAMOTIDINE 20 MG PO TABS
20.0000 mg | ORAL_TABLET | Freq: Once | ORAL | Status: AC
  Administered 2022-05-02: 20 mg via ORAL
  Filled 2022-05-02: qty 1

## 2022-05-02 NOTE — ED Notes (Signed)
Pt A&OX4 ambulatory at d/c with independent steady gait. Pt verbalized understanding of d/c instructions, prescriptions and follow up care. 

## 2022-05-02 NOTE — Discharge Instructions (Signed)
Take the steroids and antihistamines as prescribed.  Follow-up with your doctor.  Return to the ED with difficulty breathing, chest pain, tongue or lip swelling or other concerns.

## 2022-05-02 NOTE — ED Triage Notes (Signed)
Pt arrived complaing of hives and itching all over his back, legs and arms.   Pt denies changes to detergents and soaps  And no new foods  Pt states this happen before but resolved on its own  Denies OTC medications   Denies throat swelling and shortness of breath

## 2022-05-02 NOTE — ED Provider Notes (Signed)
[facility] EMERGENCY DEPARTMENT Provider Note   CSN: [id number] Arrival date & time: 05/02/22  2010     History  Chief Complaint  Patient presents with   Rash    Trevor Terrell is a 41 y.o. male.  Patient with rash to bilateral arms, buttocks, legs and back.  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  Denies any new exposures.  Does not know what he was exposed to.  Complains of itching to his bilateral hands, legs, back and arms.  No difficulty breathing difficulty swallowing.  No chest pain or shortness of breath.  No tongue swelling lip swelling.  No known allergies.  No fevers, chills, nausea or vomiting. No one else with similar rash.  The history is provided by the patient.  Rash Associated symptoms: no abdominal pain, no nausea, no shortness of breath and not vomiting        Home Medications Prior to Admission medications   Medication Sig Start Date End Date Taking? Authorizing Provider  Multiple Vitamin (MULITIVITAMIN WITH MINERALS) TABS Take 1 tablet by mouth daily.    [provider]      Allergies    Patient has no known allergies.    Review of Systems   Review of Systems  Constitutional:  Negative for activity change, appetite change and chills.  Respiratory:  Negative for cough, chest tightness and shortness of breath.   Cardiovascular:  Negative for chest pain.  Gastrointestinal:  Negative for abdominal pain, nausea and vomiting.  Genitourinary:  Negative for dysuria and hematuria.  Skin:  Positive for rash.   all other systems are negative except as noted in the HPI and PMH.    Physical Exam Updated Vital Signs BP (!) 143/89 (BP Location: Left Arm)   Pulse 81   Temp 98.5 F (36.9 C) (Oral)   Resp 18   Ht 5\' 11"  (1.803 m)   Wt 121.6 kg   SpO2 97%   BMI 37.38 kg/m  Physical Exam Vitals and nursing note reviewed.  Constitutional:      General: He is not in acute distress.    Appearance: He is  well-developed.  HENT:     Head: Normocephalic and atraumatic.     Mouth/Throat:     Pharynx: No oropharyngeal exudate.     Comments: No tongue or lip swelling Eyes:     Conjunctiva/sclera: Conjunctivae normal.     Pupils: Pupils are equal, round, and reactive to light.  Neck:     Comments: No meningismus. Cardiovascular:     Rate and Rhythm: Normal rate and regular rhythm.     Heart sounds: Normal heart sounds. No murmur heard. Pulmonary:     Effort: Pulmonary effort is normal. No respiratory distress.     Breath sounds: Normal breath sounds.  Abdominal:     Palpations: Abdomen is soft.     Tenderness: There is no abdominal tenderness. There is no guarding or rebound.  Musculoskeletal:        General: No tenderness. Normal range of motion.     Cervical back: Normal range of motion and neck supple.  Skin:    General: Skin is warm.     Findings: Rash present.     Comments: Urticaria to dorsal forearms, low back upper back.  Neurological:     Mental Status: He is alert and oriented to person, place, and time.     Cranial Nerves: No cranial nerve deficit.  Motor: No abnormal muscle tone.     Coordination: Coordination normal.     Comments: No ataxia on finger to nose bilaterally. No pronator drift. 5/5 strength throughout. CN 2-12 intact.Equal grip strength. Sensation intact.   Psychiatric:        Behavior: Behavior normal.     ED Results / Procedures / Treatments   Labs (all labs ordered are listed, but only abnormal results are displayed) Labs Reviewed - No data to display  EKG None  Radiology No results found.  Procedures Procedures    Medications Ordered in ED Medications  predniSONE (DELTASONE) tablet 60 mg (has no administration in time range)  diphenhydrAMINE (BENADRYL) capsule 25 mg (has no administration in time range)  famotidine (PEPCID) tablet 20 mg (has no administration in time range)    ED Course/ Medical Decision Making/ A&P                            Medical Decision Making Amount and/or Complexity of Data Reviewed Labs: ordered. Decision-making details documented in ED Course. Radiology: ordered and independent interpretation performed. Decision-making details documented in ED Course. ECG/medicine tests: ordered and independent interpretation performed. Decision-making details documented in ED Course.  Risk OTC drugs. Prescription drug management.  Urticaria of uncertain etiology.  No evidence of anaphylaxis.  No hypoxia or increased work of breathing.  Patient to be given prednisone as well as antihistamines.  No evidence of anaphylaxis.  Tolerating PO. Ambulatory. No tongue or lip swelling.   Course of PO and topical steroids. Antihistamines prn.  Followup with PCP.  Return to the ED if difficulty breathing, difficulty swallowing, tongue or lip swelling or any other concerns.        Final Clinical Impression(s) / ED Diagnoses Final diagnoses:  Urticaria    Rx / DC Orders ED Discharge Orders     None         Rayce Brahmbhatt, Jeannett Senior, MD 05/02/22 2158

## 2023-07-11 NOTE — Progress Notes (Unsigned)
   Complete physical exam  Patient: Trevor Terrell   DOB: 11/30/1980   43 y.o. Male  MRN: [id number]  Subjective:    No chief complaint on file.  43 yo F presenting to establish care today Trevor Terrell is a 43 y.o. male who presents today for a complete physical exam. He reports consuming a {diet types:17450} diet. {types:19826} He generally feels {DESC; WELL/FAIRLY WELL/POORLY:18703}. He reports sleeping {DESC; WELL/FAIRLY WELL/POORLY:18703}. He {does/does not:200015} have additional problems to discuss today.    Most recent fall risk assessment:     No data to display           Most recent depression screenings:     No data to display          {VISON DENTAL STD PSA (Optional):27386}  {History (Optional):23778}  Patient Care Team: Default, Provider, MD as PCP - General (Orthopedic Surgery)   Outpatient Medications Prior to Visit  Medication Sig   diphenhydrAMINE (BENADRYL) 25 MG tablet Take 1 tablet (25 mg total) by mouth every 6 (six) hours as needed for itching.   famotidine (PEPCID) 20 MG tablet Take 1 tablet (20 mg total) by mouth daily.   hydrocortisone cream 1 % Apply to affected area 2 times daily   Multiple Vitamin (MULITIVITAMIN WITH MINERALS) TABS Take 1 tablet by mouth daily.   predniSONE (DELTASONE) 50 MG tablet 1 tablet PO daily   No facility-administered medications prior to visit.    ROS        Objective:     There were no vitals taken for this visit. {Vitals History (Optional):23777}  Physical Exam   No results found for any visits on 07/13/23. {Show previous labs (optional):23779}     Assessment & Plan:    Routine Health Maintenance and Physical Exam   Health Maintenance  Topic Date Due   HIV Screening  Never done   Hepatitis C Screening  Never done   DTaP/Tdap/Td vaccine (1 - Tdap) Never done   Flu Shot  Never done   COVID-19 Vaccine (1 - 2024-25 season) Never done   HPV Vaccine  Aged Out     Discussed health  benefits of physical activity, and encouraged him to engage in regular exercise appropriate for his age and condition.  There are no diagnoses linked to this encounter.   No follow-ups on file.     Moshe Cipro, FNP

## 2023-07-13 ENCOUNTER — Encounter: Payer: Self-pay | Admitting: Family Medicine

## 2023-07-13 ENCOUNTER — Ambulatory Visit: Payer: BC Managed Care – PPO | Admitting: Family Medicine

## 2023-07-13 VITALS — BP 128/80 | HR 85 | Temp 97.8°F | Ht 71.0 in | Wt 282.6 lb

## 2023-07-13 DIAGNOSIS — E66812 Obesity, class 2: Secondary | ICD-10-CM

## 2023-07-13 DIAGNOSIS — Z2821 Immunization not carried out because of patient refusal: Secondary | ICD-10-CM

## 2023-07-13 DIAGNOSIS — Z136 Encounter for screening for cardiovascular disorders: Secondary | ICD-10-CM | POA: Diagnosis not present

## 2023-07-13 DIAGNOSIS — G4733 Obstructive sleep apnea (adult) (pediatric): Secondary | ICD-10-CM

## 2023-07-13 DIAGNOSIS — K219 Gastro-esophageal reflux disease without esophagitis: Secondary | ICD-10-CM

## 2023-07-13 DIAGNOSIS — Z6839 Body mass index (BMI) 39.0-39.9, adult: Secondary | ICD-10-CM

## 2023-07-13 DIAGNOSIS — R7303 Prediabetes: Secondary | ICD-10-CM | POA: Diagnosis not present

## 2023-07-13 DIAGNOSIS — J4520 Mild intermittent asthma, uncomplicated: Secondary | ICD-10-CM | POA: Diagnosis not present

## 2023-07-13 DIAGNOSIS — Z0000 Encounter for general adult medical examination without abnormal findings: Secondary | ICD-10-CM

## 2023-07-13 LAB — COMPREHENSIVE METABOLIC PANEL
ALT: 56 U/L — ABNORMAL HIGH (ref 0–53)
AST: 32 U/L (ref 0–37)
Albumin: 4.2 g/dL (ref 3.5–5.2)
Alkaline Phosphatase: 96 U/L (ref 39–117)
BUN: 9 mg/dL (ref 6–23)
CO2: 27 meq/L (ref 19–32)
Calcium: 8.8 mg/dL (ref 8.4–10.5)
Chloride: 105 meq/L (ref 96–112)
Creatinine, Ser: 1.14 mg/dL (ref 0.40–1.50)
GFR: 79.24 mL/min (ref >60.00)
Glucose, Bld: 106 mg/dL — ABNORMAL HIGH (ref 70–99)
Potassium: 4.2 meq/L (ref 3.5–5.1)
Sodium: 138 meq/L (ref 135–145)
Total Bilirubin: 0.5 mg/dL (ref 0.2–1.2)
Total Protein: 7.7 g/dL (ref 6.0–8.3)

## 2023-07-13 LAB — CBC WITH DIFFERENTIAL/PLATELET
Basophils Absolute: 0 10*3/uL (ref 0.0–0.1)
Basophils Relative: 0.8 % (ref 0.0–3.0)
Eosinophils Absolute: 0.2 10*3/uL (ref 0.0–0.7)
Eosinophils Relative: 2.6 % (ref 0.0–5.0)
HCT: 47.3 % (ref 39.0–52.0)
Hemoglobin: 15.7 g/dL (ref 13.0–17.0)
Lymphocytes Relative: 44.6 % (ref 12.0–46.0)
Lymphs Abs: 2.6 10*3/uL (ref 0.7–4.0)
MCHC: 33.3 g/dL (ref 30.0–36.0)
MCV: 88.6 fL (ref 78.0–100.0)
Monocytes Absolute: 0.5 10*3/uL (ref 0.1–1.0)
Monocytes Relative: 8.8 % (ref 3.0–12.0)
Neutro Abs: 2.5 10*3/uL (ref 1.4–7.7)
Neutrophils Relative %: 43.2 % (ref 43.0–77.0)
Platelets: 314 10*3/uL (ref 150.0–400.0)
RBC: 5.34 Mil/uL (ref 4.22–5.81)
RDW: 13.5 % (ref 11.5–15.5)
WBC: 5.8 10*3/uL (ref 4.0–10.5)

## 2023-07-13 LAB — LIPID PANEL
Cholesterol: 185 mg/dL (ref 0–200)
HDL: 27.3 mg/dL — ABNORMAL LOW (ref >39.00)
LDL Cholesterol: 140 mg/dL — ABNORMAL HIGH (ref 0–99)
NonHDL: 157.62
Total CHOL/HDL Ratio: 7
Triglycerides: 87 mg/dL (ref 0.0–149.0)
VLDL: 17.4 mg/dL (ref 0.0–40.0)

## 2023-07-13 LAB — HEMOGLOBIN A1C: Hgb A1c MFr Bld: 6.1 % (ref 4.6–6.5)

## 2023-07-13 NOTE — Patient Instructions (Addendum)
Welcome to [facility]!  We have completed your physical today.  We are checking labs today, will be in contact with any results that require further attention.  Results will automatically be available in MyChart. If there is anything that we need to follow up on, or re check, we will be calling to let you know.  Follow up with me in a year for your next physical.  Biotene mouth rinse for dry mouth.

## 2023-07-16 ENCOUNTER — Encounter: Payer: Self-pay | Admitting: Family Medicine
